# Patient Record
Sex: Female | Born: 1970 | Race: White | Hispanic: No | Marital: Single | Smoking: Never smoker
Health system: Southern US, Community
[De-identification: ages and names within clinical notes are randomized; demographics above are authoritative.]

## PROBLEM LIST (undated history)

## (undated) DIAGNOSIS — K5792 Diverticulitis of intestine, part unspecified, without perforation or abscess without bleeding: Secondary | ICD-10-CM

## (undated) DIAGNOSIS — N390 Urinary tract infection, site not specified: Secondary | ICD-10-CM

## (undated) DIAGNOSIS — E039 Hypothyroidism, unspecified: Secondary | ICD-10-CM

## (undated) DIAGNOSIS — E049 Nontoxic goiter, unspecified: Secondary | ICD-10-CM

## (undated) HISTORY — PX: LIPOSUCTION: SHX10

## (undated) HISTORY — PX: BREAST REDUCTION SURGERY: SHX8

---

## 2013-04-16 DIAGNOSIS — E039 Hypothyroidism, unspecified: Secondary | ICD-10-CM | POA: Insufficient documentation

## 2013-04-16 DIAGNOSIS — R21 Rash and other nonspecific skin eruption: Secondary | ICD-10-CM | POA: Insufficient documentation

## 2013-04-16 DIAGNOSIS — Z8249 Family history of ischemic heart disease and other diseases of the circulatory system: Secondary | ICD-10-CM | POA: Insufficient documentation

## 2013-04-19 DIAGNOSIS — N6459 Other signs and symptoms in breast: Secondary | ICD-10-CM | POA: Insufficient documentation

## 2013-04-19 DIAGNOSIS — Q838 Other congenital malformations of breast: Secondary | ICD-10-CM | POA: Insufficient documentation

## 2017-02-09 DIAGNOSIS — N978 Female infertility of other origin: Secondary | ICD-10-CM | POA: Insufficient documentation

## 2021-02-27 ENCOUNTER — Emergency Department: Payer: 59

## 2021-02-27 ENCOUNTER — Other Ambulatory Visit: Payer: Self-pay

## 2021-02-27 ENCOUNTER — Emergency Department
Admission: EM | Admit: 2021-02-27 | Discharge: 2021-02-27 | Disposition: A | Discharge status: Home / Self Care | Payer: 59 | Attending: Emergency Medicine | Admitting: Emergency Medicine

## 2021-02-27 ENCOUNTER — Encounter: Payer: Self-pay | Admitting: Emergency Medicine

## 2021-02-27 DIAGNOSIS — K5792 Diverticulitis of intestine, part unspecified, without perforation or abscess without bleeding: Secondary | ICD-10-CM | POA: Diagnosis not present

## 2021-02-27 DIAGNOSIS — R1032 Left lower quadrant pain: Secondary | ICD-10-CM | POA: Diagnosis present

## 2021-02-27 LAB — COMPREHENSIVE METABOLIC PANEL
ALT: 51 U/L — ABNORMAL HIGH (ref 0–44)
AST: 28 U/L (ref 15–41)
Albumin: 4.1 g/dL (ref 3.5–5.0)
Alkaline Phosphatase: 88 U/L (ref 38–126)
Anion gap: 10 (ref 5–15)
BUN: 9 mg/dL (ref 6–20)
CO2: 27 mmol/L (ref 22–32)
Calcium: 9.5 mg/dL (ref 8.9–10.3)
Chloride: 101 mmol/L (ref 98–111)
Creatinine, Ser: 0.9 mg/dL (ref 0.44–1.00)
GFR, Estimated: >60 mL/min (ref >60)
Glucose, Bld: 114 mg/dL — ABNORMAL HIGH (ref 70–99)
Potassium: 3.8 mmol/L (ref 3.5–5.1)
Sodium: 138 mmol/L (ref 135–145)
Total Bilirubin: 1 mg/dL (ref 0.3–1.2)
Total Protein: 8.2 g/dL — ABNORMAL HIGH (ref 6.5–8.1)

## 2021-02-27 LAB — LIPASE, BLOOD: Lipase: 26 U/L (ref 11–51)

## 2021-02-27 LAB — CBC
HCT: 43.5 % (ref 36.0–46.0)
Hemoglobin: 14.6 g/dL (ref 12.0–15.0)
MCH: 29 pg (ref 26.0–34.0)
MCHC: 33.6 g/dL (ref 30.0–36.0)
MCV: 86.3 fL (ref 80.0–100.0)
Platelets: 298 10*3/uL (ref 150–400)
RBC: 5.04 MIL/uL (ref 3.87–5.11)
RDW: 12.2 % (ref 11.5–15.5)
WBC: 10 10*3/uL (ref 4.0–10.5)
nRBC: 0 % (ref 0.0–0.2)

## 2021-02-27 LAB — URINALYSIS, COMPLETE (UACMP) WITH MICROSCOPIC
Bilirubin Urine: NEGATIVE
Glucose, UA: NEGATIVE mg/dL
Ketones, ur: 5 mg/dL — AB
Leukocytes,Ua: NEGATIVE
Nitrite: NEGATIVE
Protein, ur: 100 mg/dL — AB
Specific Gravity, Urine: 1.023 (ref 1.005–1.030)
pH: 5 (ref 5.0–8.0)

## 2021-02-27 LAB — PREGNANCY, URINE: Preg Test, Ur: NEGATIVE

## 2021-02-27 MED ORDER — AMOXICILLIN-POT CLAVULANATE 875-125 MG PO TABS: 1.0000 | ORAL_TABLET | Freq: Two times a day (BID) | ORAL | 0 refills | Status: AC

## 2021-02-27 MED ORDER — DOCUSATE SODIUM 100 MG PO CAPS: 100.0000 mg | ORAL_CAPSULE | Freq: Two times a day (BID) | ORAL | 2 refills | Status: AC

## 2021-02-27 MED ORDER — ONDANSETRON 4 MG PO TBDP: 4.0000 mg | ORAL_TABLET | Freq: Three times a day (TID) | ORAL | 0 refills | Status: AC | PRN

## 2021-02-27 MED ORDER — HYDROCODONE-ACETAMINOPHEN 5-325 MG PO TABS: 1.0000 | ORAL_TABLET | ORAL | 0 refills | Status: AC | PRN

## 2021-02-27 MED ORDER — AMOXICILLIN-POT CLAVULANATE 400-57 MG/5ML PO SUSR: 875.0000 mg | Freq: Two times a day (BID) | ORAL | 0 refills | Status: AC

## 2021-02-27 NOTE — ED Triage Notes (Signed)
Pt in with sharp and intermittent LLQ x 5 days. Reports some n/v, no diarrhea or blood in stool noted. Denies any fevers, area tender to palpation

## 2021-02-27 NOTE — ED Provider Notes (Signed)
[facility] Emergency Department Provider Note   ____________________________________________    I have reviewed the triage vital signs and the nursing notes.   HISTORY  Chief Complaint Abdominal Pain     HPI Katherine Landry is a 50 y.o. female who presents with complaints of left lower quadrant abdominal pain.  Patient reports several days of left lower quadrant abdominal pain, seems to have waxing waning quality, at times it is severe.  She has never had this before.  No history of abdominal surgeries.  Denies dysuria, no hematuria.  No fevers chills, no vomiting.  Has not take anything for this.  History reviewed. No pertinent past medical history.  There are no problems to display for this patient.   History reviewed. No pertinent surgical history.  Prior to Admission medications   Medication Sig Start Date End Date Taking? Authorizing Provider  amoxicillin-clavulanate (AUGMENTIN) 400-57 MG/5ML suspension Take 10.9 mLs (875 mg total) by mouth 2 (two) times daily for 7 days. 02/27/21 03/06/21 Yes Jene Every, MD  amoxicillin-clavulanate (AUGMENTIN) 875-125 MG tablet Take 1 tablet by mouth 2 (two) times daily for 7 days. 02/27/21 03/06/21 Yes Jene Every, MD  docusate sodium (COLACE) 100 MG capsule Take 1 capsule (100 mg total) by mouth 2 (two) times daily. 02/27/21 02/27/22 Yes Jene Every, MD  HYDROcodone-acetaminophen (NORCO/VICODIN) 5-325 MG tablet Take 1 tablet by mouth every 4 (four) hours as needed for moderate pain. 02/27/21  Yes Jene Every, MD  ondansetron (ZOFRAN ODT) 4 MG disintegrating tablet Take 1 tablet (4 mg total) by mouth every 8 (eight) hours as needed. 02/27/21  Yes Jene Every, MD     Allergies Macrobid [nitrofurantoin] and Sulfa antibiotics  No family history on file.  Social History Social History   Tobacco Use  . Smoking status: Never Smoker  . Smokeless tobacco: Never Used  Substance Use Topics  . Alcohol use:  Never  . Drug use: Never    Review of Systems  Constitutional: No fever/chills Eyes: No visual changes.  ENT: No sore throat. Cardiovascular: Denies chest pain. Respiratory: Denies shortness of breath. Gastrointestinal: As above Genitourinary: As above Musculoskeletal: Negative for back pain. Skin: Negative for rash. Neurological: Negative for headaches or weakness   ____________________________________________   PHYSICAL EXAM:  VITAL SIGNS: ED Triage Vitals [02/27/21 1014]  Enc Vitals Group     BP 131/75     Pulse Rate 88     Resp 18     Temp 98.4 F (36.9 C)     Temp Source Oral     SpO2 98 %     Weight 70.3 kg (155 lb)     Height 1.321 m (4\' 4" )     Head Circumference      Peak Flow      Pain Score      Pain Loc      Pain Edu?      Excl. in GC?     Constitutional: Alert and oriented.   Nose: No congestion/rhinnorhea. Mouth/Throat: Mucous membranes are moist.   Neck:  Painless ROM Cardiovascular: Normal rate, regular rhythm.  Good peripheral circulation. Respiratory: Normal respiratory effort.  No retractions. Lungs CTAB. Gastrointestinal: Mild tenderness palpation left lower quadrant no distention.  No CVA tenderness. Musculoskeletal:   Warm and well perfused Neurologic:  Normal speech and language. No gross focal neurologic deficits are appreciated.  Skin:  Skin is warm, dry and intact. No rash noted. Psychiatric: Mood and affect are normal. Speech and behavior  are normal.  ____________________________________________   LABS (all labs ordered are listed, but only abnormal results are displayed)  Labs Reviewed  COMPREHENSIVE METABOLIC PANEL - Abnormal; Notable for the following components:      Result Value   Glucose, Bld 114 (*)    Total Protein 8.2 (*)    ALT 51 (*)    All other components within normal limits  URINALYSIS, COMPLETE (UACMP) WITH MICROSCOPIC - Abnormal; Notable for the following components:   Color, Urine AMBER (*)     APPearance CLOUDY (*)    Hgb urine dipstick SMALL (*)    Ketones, ur 5 (*)    Protein, ur 100 (*)    Bacteria, UA MANY (*)    All other components within normal limits  LIPASE, BLOOD  CBC  PREGNANCY, URINE  POC URINE PREG, ED   ____________________________________________  EKG  None ____________________________________________  RADIOLOGY  CT renal stone study reviewed by me, consistent with diverticulitis ____________________________________________   PROCEDURES  Procedure(s) performed: No  Procedures   Critical Care performed: No ____________________________________________   INITIAL IMPRESSION / ASSESSMENT AND PLAN / ED COURSE  Pertinent labs & imaging results that were available during my care of the patient were reviewed by me and considered in my medical decision making (see chart for details).  Patient presents with left lower quadrant abdominal pain as detailed above, lab work is overall reassuring, normal white blood cell count.  Small mount of hemoglobin in the urine.  Recommended CT abdomen pelvis with IV contrast as well as analgesics and fluid and Zofran however patient refused IV, she would rather have Noncon scan she does not want an IV.  She understands that a noncontrast scan is not as sensitive as contrast scan.  CT renal stone study demonstrates acute diverticulitis, no perforation or rupture, lab work is reassuring, patient feels well she would like to go home she does not want to stay in the hospital.  We will treat with Augmentin, analgesics, Zofran, strict return precautions patient agrees with plan.    ____________________________________________   FINAL CLINICAL IMPRESSION(S) / ED DIAGNOSES  Final diagnoses:  Diverticulitis        Note:  This document was prepared using Dragon voice recognition software and may include unintentional dictation errors.   Jene Every, MD 02/27/21 1321

## 2021-02-27 NOTE — ED Notes (Signed)
Lab called for urine pregnancy to be added to urine already sent

## 2021-02-27 NOTE — ED Notes (Signed)
Patient transported to CT 

## 2021-04-13 ENCOUNTER — Ambulatory Visit: Payer: 59 | Admitting: Gastroenterology

## 2021-04-13 ENCOUNTER — Encounter: Payer: Self-pay | Admitting: Gastroenterology

## 2021-04-13 ENCOUNTER — Other Ambulatory Visit: Payer: Self-pay

## 2021-04-13 VITALS — BP 124/84 | HR 73 | Temp 98.4°F | Ht 64.0 in | Wt 160.8 lb

## 2021-04-13 DIAGNOSIS — K5732 Diverticulitis of large intestine without perforation or abscess without bleeding: Secondary | ICD-10-CM | POA: Diagnosis not present

## 2021-04-13 DIAGNOSIS — R748 Abnormal levels of other serum enzymes: Secondary | ICD-10-CM

## 2021-04-13 DIAGNOSIS — Z1211 Encounter for screening for malignant neoplasm of colon: Secondary | ICD-10-CM

## 2021-04-13 MED ORDER — PEG 3350-KCL-NA BICARB-NACL 420 G PO SOLR: ORAL | 0 refills | Status: AC

## 2021-04-13 NOTE — Patient Instructions (Signed)
High-Fiber Eating Plan Fiber, also called dietary fiber, is a type of carbohydrate. It is found foods such as fruits, vegetables, whole grains, and beans. A high-fiber diet can have many health benefits. Your health care provider may recommend a high-fiber diet to help:  Prevent constipation. Fiber can make your bowel movements more regular.  Lower your cholesterol.  Relieve the following conditions: ? Inflammation of veins in the anus (hemorrhoids). ? Inflammation of specific areas of the digestive tract (uncomplicated diverticulosis). ? A problem of the large intestine, also called the colon, that sometimes causes pain and diarrhea (irritable bowel syndrome, or IBS).  Prevent overeating as part of a weight-loss plan.  Prevent heart disease, type 2 diabetes, and certain cancers. What are tips for following this plan? Reading food labels  Check the nutrition facts label on food products for the amount of dietary fiber. Choose foods that have 5 grams of fiber or more per serving.  The goals for recommended daily fiber intake include: ? Men (age 50 or younger): 34-38 g. ? Men (over age 50): 28-34 g. ? Women (age 50 or younger): 25-28 g. ? Women (over age 50): 22-25 g. Your daily fiber goal is _____________ g.   Shopping  Choose whole fruits and vegetables instead of processed forms, such as apple juice or applesauce.  Choose a wide variety of high-fiber foods such as avocados, lentils, oats, and kidney beans.  Read the nutrition facts label of the foods you choose. Be aware of foods with added fiber. These foods often have high sugar and sodium amounts per serving. Cooking  Use whole-grain flour for baking and cooking.  Cook with brown rice instead of white rice. Meal planning  Start the day with a breakfast that is high in fiber, such as a cereal that contains 5 g of fiber or more per serving.  Eat breads and cereals that are made with whole-grain flour instead of refined  flour or white flour.  Eat brown rice, bulgur wheat, or millet instead of white rice.  Use beans in place of meat in soups, salads, and pasta dishes.  Be sure that half of the grains you eat each day are whole grains. General information  You can get the recommended daily intake of dietary fiber by: ? Eating a variety of fruits, vegetables, grains, nuts, and beans. ? Taking a fiber supplement if you are not able to take in enough fiber in your diet. It is better to get fiber through food than from a supplement.  Gradually increase how much fiber you consume. If you increase your intake of dietary fiber too quickly, you may have bloating, cramping, or gas.  Drink plenty of water to help you digest fiber.  Choose high-fiber snacks, such as berries, raw vegetables, nuts, and popcorn. What foods should I eat? Fruits Berries. Pears. Apples. Oranges. Avocado. Prunes and raisins. Dried figs. Vegetables Sweet potatoes. Spinach. Kale. Artichokes. Cabbage. Broccoli. Cauliflower. Green peas. Carrots. Squash. Grains Whole-grain breads. Multigrain cereal. Oats and oatmeal. Brown rice. Barley. Bulgur wheat. Millet. Quinoa. Bran muffins. Popcorn. Rye wafer crackers. Meats and other proteins Navy beans, kidney beans, and pinto beans. Soybeans. Split peas. Lentils. Nuts and seeds. Dairy Fiber-fortified yogurt. Beverages Fiber-fortified soy milk. Fiber-fortified orange juice. Other foods Fiber bars. The items listed above may not be a complete list of recommended foods and beverages. Contact a dietitian for more information. What foods should I avoid? Fruits Fruit juice. Cooked, strained fruit. Vegetables Fried potatoes. Canned vegetables. Well-cooked vegetables. Grains   White bread. Pasta made with refined flour. White rice. Meats and other proteins Fatty cuts of meat. Fried chicken or fried fish. Dairy Milk. Yogurt. Cream cheese. Sour cream. Fats and oils Butters. Beverages Soft  drinks. Other foods Cakes and pastries. The items listed above may not be a complete list of foods and beverages to avoid. Talk with your dietitian about what choices are best for you. Summary  Fiber is a type of carbohydrate. It is found in foods such as fruits, vegetables, whole grains, and beans.  A high-fiber diet has many benefits. It can help to prevent constipation, lower blood cholesterol, aid weight loss, and reduce your risk of heart disease, diabetes, and certain cancers.  Increase your intake of fiber gradually. Increasing fiber too quickly may cause cramping, bloating, and gas. Drink plenty of water while you increase the amount of fiber you consume.  The best sources of fiber include whole fruits and vegetables, whole grains, nuts, seeds, and beans. This information is not intended to replace advice given to you by your health care provider. Make sure you discuss any questions you have with your health care provider. Document Revised: 04/16/2020 Document Reviewed: 04/16/2020 Elsevier Patient Education  2021 Elsevier Inc.  

## 2021-04-13 NOTE — Addendum Note (Signed)
Addended by: Adela Ports on: 04/13/2021 02:47 PM   Modules accepted: Orders

## 2021-04-13 NOTE — Progress Notes (Signed)
[facility] [address]  [address]  [place], [place] [address]  Main: [phone]  Fax: [phone]   Gastroenterology Consultation  Referring Provider:     Ann Maki, MD Primary Care Physician:  Patient, No Pcp Per (Inactive) Reason for Consultation:     Diverticulitis        HPI:    Chief Complaint  Patient presents with  . Diverticulitis    Angelic Schnelle is a 50 y.o. y/o female referred for consultation & management  by Dr. Patient, No Pcp Per (Inactive).  Patient went to the ER on 02/27/2021 due to abdominal pain and was diagnosed with diverticulitis.  She received antibiotics and was discharged from the ER without oral antibiotics.  She completed her course and has had complete resolution of her abdominal pain.  No previous episodes of diverticulitis.  No blood in stool.  No altered bowel habits.  No family history of colon cancer.  No prior colonoscopy.  Patient denies any dysphagia, heartburn, weight loss.  No past medical history on file.  No past surgical history on file.  Prior to Admission medications   Medication Sig Start Date End Date Taking? Authorizing Provider  docusate sodium (COLACE) 100 MG capsule Take 1 capsule (100 mg total) by mouth 2 (two) times daily. 02/27/21 02/27/22  Jene Every, MD  HYDROcodone-acetaminophen (NORCO/VICODIN) 5-325 MG tablet Take 1 tablet by mouth every 4 (four) hours as needed for moderate pain. 02/27/21   Jene Every, MD  levothyroxine (SYNTHROID) 25 MCG tablet Take 1 tablet by mouth daily.    [provider]  ondansetron (ZOFRAN ODT) 4 MG disintegrating tablet Take 1 tablet (4 mg total) by mouth every 8 (eight) hours as needed. 02/27/21   Jene Every, MD    No family history on file.   Social History   Tobacco Use  . Smoking status: Never Smoker  . Smokeless tobacco: Never Used  Substance Use Topics  . Alcohol use: Never  . Drug use: Never    Allergies as of 04/13/2021 - Review Complete  02/27/2021  Allergen Reaction Noted  . Macrobid [nitrofurantoin]  02/27/2021  . Sulfa antibiotics  02/27/2021    Review of Systems:    All systems reviewed and negative except where noted in HPI.   Physical Exam:  BP 124/84   Pulse 73   Temp 98.4 F (36.9 C) (Oral)   Ht 5\' 4"  (1.626 m)   Wt 160 lb 12.8 oz (72.9 kg)   BMI 27.60 kg/m  No LMP recorded. Psych:  Alert and cooperative. Normal mood and affect. General:   Alert,  Well-developed, well-nourished, pleasant and cooperative in NAD Head:  Normocephalic and atraumatic. Eyes:  Sclera clear, no icterus.   Conjunctiva pink. Ears:  Normal auditory acuity. Nose:  No deformity, discharge, or lesions. Mouth:  No deformity or lesions,oropharynx pink & moist. Neck:  Supple; no masses or thyromegaly. Abdomen:  Normal bowel sounds.  No bruits.  Soft, non-tender and non-distended without masses, hepatosplenomegaly or hernias noted.  No guarding or rebound tenderness.    Msk:  Symmetrical without gross deformities. Good, equal movement & strength bilaterally. Pulses:  Normal pulses noted. Extremities:  No clubbing or edema.  No cyanosis. Neurologic:  Alert and oriented x3;  grossly normal neurologically. Skin:  Intact without significant lesions or rashes. No jaundice. Lymph Nodes:  No significant cervical adenopathy. Psych:  Alert and cooperative. Normal mood and affect.   Labs: CBC    Component Value Date/Time  WBC 10.0 02/27/2021 1015   RBC 5.04 02/27/2021 1015   HGB 14.6 02/27/2021 1015   HCT 43.5 02/27/2021 1015   PLT 298 02/27/2021 1015   MCV 86.3 02/27/2021 1015   MCH 29.0 02/27/2021 1015   MCHC 33.6 02/27/2021 1015   RDW 12.2 02/27/2021 1015   CMP     Component Value Date/Time   NA 138 02/27/2021 1015   K 3.8 02/27/2021 1015   CL 101 02/27/2021 1015   CO2 27 02/27/2021 1015   GLUCOSE 114 (H) 02/27/2021 1015   BUN 9 02/27/2021 1015   CREATININE 0.90 02/27/2021 1015   CALCIUM 9.5 02/27/2021 1015   PROT 8.2  (H) 02/27/2021 1015   ALBUMIN 4.1 02/27/2021 1015   AST 28 02/27/2021 1015   ALT 51 (H) 02/27/2021 1015   ALKPHOS 88 02/27/2021 1015   BILITOT 1.0 02/27/2021 1015   GFRNONAA >60 02/27/2021 1015    Imaging Studies: No results found.  Assessment and Plan:   Lameeka Schleifer is a 50 y.o. y/o female has been referred for diverticulitis  High-fiber diet discussed and handout given for the same  Patient is due for screening colonoscopy, which would also allow [place] to evaluate and rule out any underlying lesions that could have contributed to her diverticulitis  I have discussed alternative options, risks & benefits,  which include, but are not limited to, bleeding, infection, perforation,respiratory complication & drug reaction.  The patient agrees with this plan & written consent will be obtained.    Patient also with mildly elevated transaminases during the ER visit.  We will repeat at this time elevated liver enzymes  Dr Melodie Bouillon  Speech recognition software was used to dictate the above note.

## 2021-04-14 ENCOUNTER — Telehealth: Payer: Self-pay

## 2021-04-14 NOTE — Telephone Encounter (Signed)
Patient has questions concerning the prep medication. Says she didn't receive a packet during her visit on yesterday.

## 2021-04-15 NOTE — Telephone Encounter (Signed)
Patient was contacted but had to leave her a detailed message letting her know about her procedure date and prep information. I also let her know that if she had further questions to call us back. Patient was also informed that I had sent her packet information by mail since she did not have a MyChart account and she had left the room before me going back to explain the process of everything before her procedure. Prep was sent to her pharmacy [facility] on [address].

## 2021-04-28 ENCOUNTER — Encounter: Payer: Self-pay | Admitting: Gastroenterology

## 2021-04-29 ENCOUNTER — Encounter
Admission: RE | Disposition: A | Discharge status: Home / Self Care | Payer: Self-pay | Source: Home / Self Care | Attending: Gastroenterology

## 2021-04-29 ENCOUNTER — Encounter: Payer: Self-pay | Admitting: Gastroenterology

## 2021-04-29 ENCOUNTER — Other Ambulatory Visit: Payer: Self-pay

## 2021-04-29 ENCOUNTER — Ambulatory Visit: Payer: 59 | Admitting: Anesthesiology

## 2021-04-29 ENCOUNTER — Ambulatory Visit
Admission: RE | Admit: 2021-04-29 | Discharge: 2021-04-29 | Disposition: A | Discharge status: Home / Self Care | Payer: 59 | Source: Home / Self Care | Attending: Gastroenterology | Admitting: Gastroenterology

## 2021-04-29 HISTORY — DX: Diverticulitis of intestine, part unspecified, without perforation or abscess without bleeding: K57.92

## 2021-04-29 HISTORY — DX: Urinary tract infection, site not specified: N39.0

## 2021-04-29 HISTORY — DX: Hypothyroidism, unspecified: E03.9

## 2021-04-29 HISTORY — DX: Nontoxic goiter, unspecified: E04.9

## 2021-04-29 SURGERY — COLONOSCOPY WITH PROPOFOL
Anesthesia: General

## 2021-04-29 MED ORDER — PROPOFOL 500 MG/50ML IV EMUL
INTRAVENOUS | Status: AC
  Filled 2021-04-29: qty 50

## 2021-04-29 MED ORDER — LIDOCAINE HCL (PF) 2 % IJ SOLN
INTRAMUSCULAR | Status: AC
  Filled 2021-04-29: qty 5

## 2021-04-29 NOTE — Anesthesia Preprocedure Evaluation (Signed)
Anesthesia Evaluation  Patient identified by MRN, date of birth, ID band Patient awake    Reviewed: Allergy & Precautions, NPO status , Patient's Chart, lab work & pertinent test results  Airway Mallampati: II  TM Distance: >3 FB     Dental  (+) Teeth Intact   Pulmonary neg pulmonary ROS,    Pulmonary exam normal        Cardiovascular negative cardio ROS Normal cardiovascular exam     Neuro/Psych negative neurological ROS  negative psych ROS   GI/Hepatic Neg liver ROS, Diverticulitis Hx   Endo/Other  Hypothyroidism   Renal/GU negative Renal ROS  negative genitourinary   Musculoskeletal negative musculoskeletal ROS (+)   Abdominal Normal abdominal exam  (+)   Peds negative pediatric ROS (+)  Hematology negative hematology ROS (+)   Anesthesia Other Findings Past Medical History: No date: Diverticulitis No date: Hypothyroidism No date: Nontoxic goiter No date: UTI (urinary tract infection)  Reproductive/Obstetrics                             Anesthesia Physical Anesthesia Plan  ASA: II  Anesthesia Plan: General   Post-op Pain Management:    Induction: Intravenous  PONV Risk Score and Plan: Propofol infusion  Airway Management Planned: Nasal Cannula  Additional Equipment:   Intra-op Plan:   Post-operative Plan:   Informed Consent: I have reviewed the patients History and Physical, chart, labs and discussed the procedure including the risks, benefits and alternatives for the proposed anesthesia with the patient or authorized representative who has indicated his/her understanding and acceptance.     Dental advisory given  Plan Discussed with: CRNA and Surgeon  Anesthesia Plan Comments:         Anesthesia Quick Evaluation

## 2021-07-14 ENCOUNTER — Ambulatory Visit: Payer: 59 | Admitting: Gastroenterology

## 2021-12-02 IMAGING — CT CT RENAL STONE PROTOCOL
2 of 4 series · 15 of 46 positions shown, 17 images · non-contrast
Comparison: None.

CLINICAL DATA: Pt in with sharp and intermittent LLQ x 5 days.
Reports some n/v, no diarrhea or blood in stool noted. Denies any
fevers, area tender to palpationFlank pain, kidney stone suspected

EXAM:
CT ABDOMEN AND PELVIS WITHOUT CONTRAST
TECHNIQUE: Multidetector CT imaging of the abdomen and pelvis was performed
following the standard protocol without IV contrast.

[Series 2: stone full standard · axial · 0.74mm/px · z∈[-489,-49]mm · 12 of 101 slices shown, 14 images]
[im 9/101  soft-tissue]
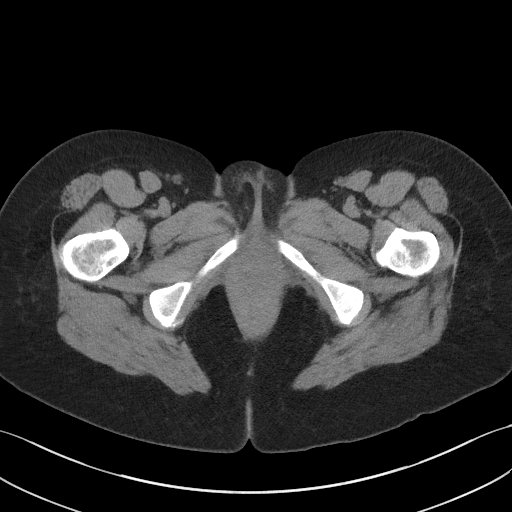
[im 9/101  bone]
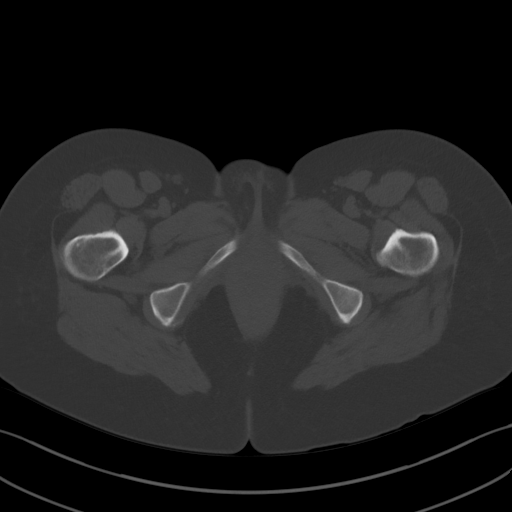
[im 17/101  soft-tissue]
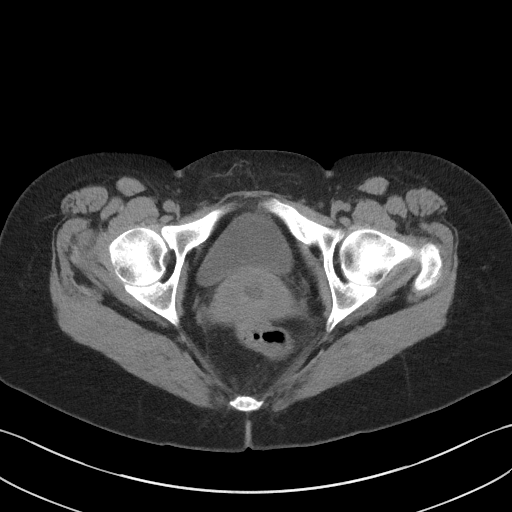
[im 25/101  soft-tissue]
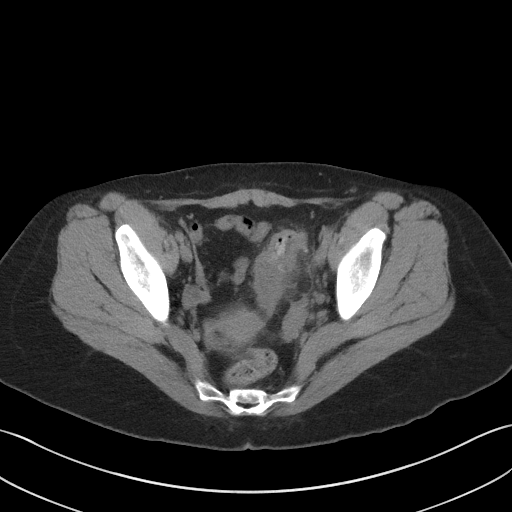
[im 33/101  soft-tissue]
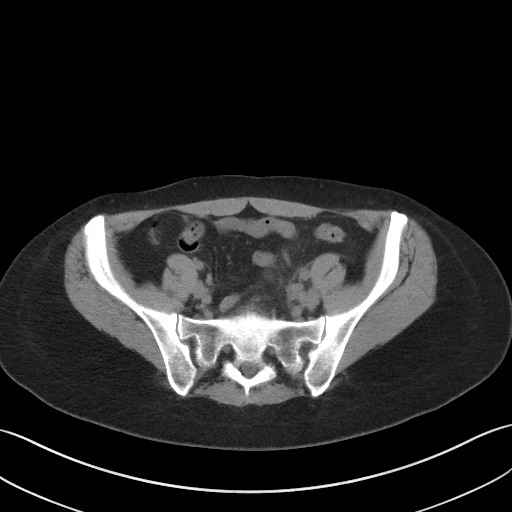
[im 41/101  soft-tissue]
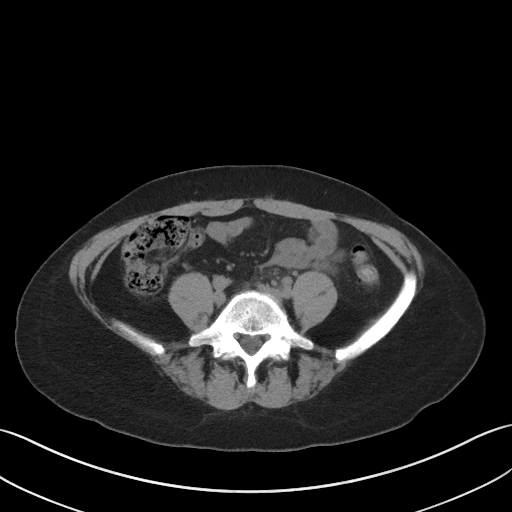
[im 49/101  soft-tissue]
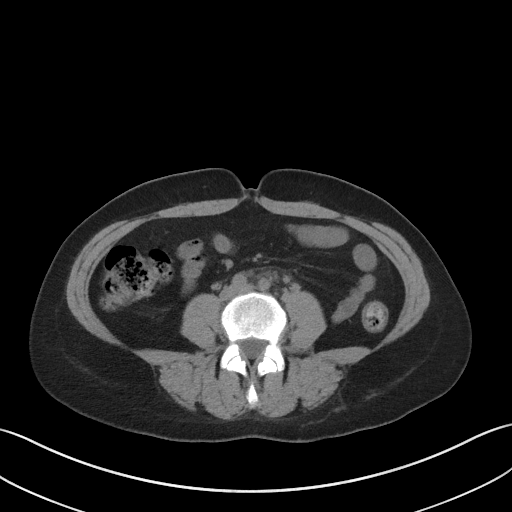
[im 57/101  soft-tissue]
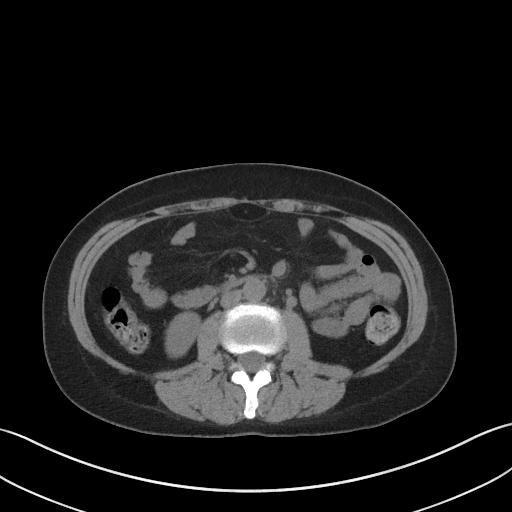
[im 65/101  soft-tissue]
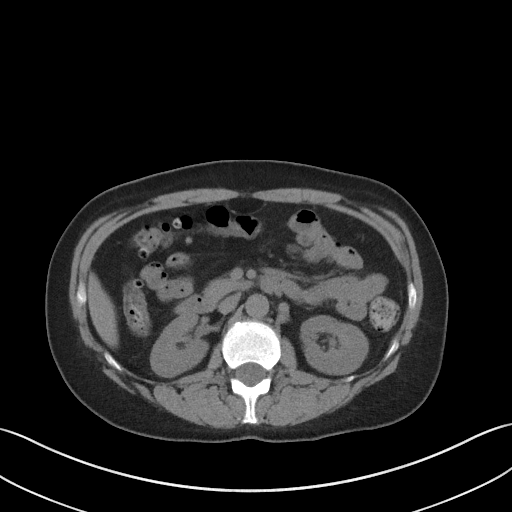
[im 73/101  soft-tissue]
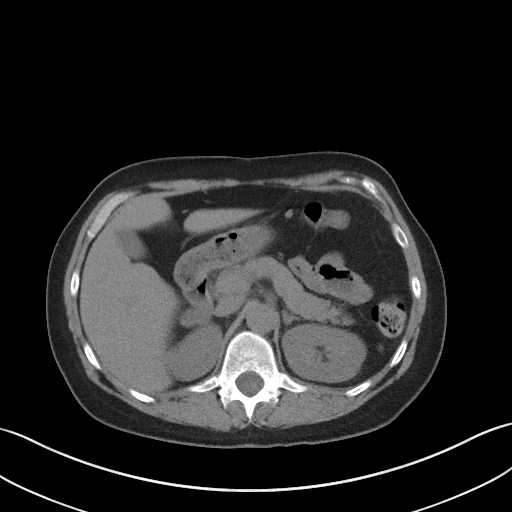
[im 73/101  bone]
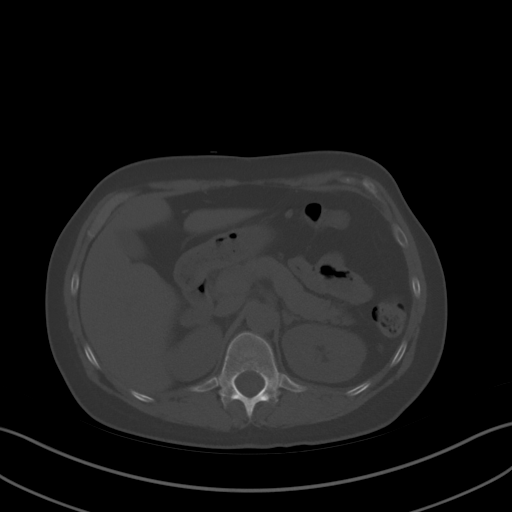
[im 81/101  soft-tissue]
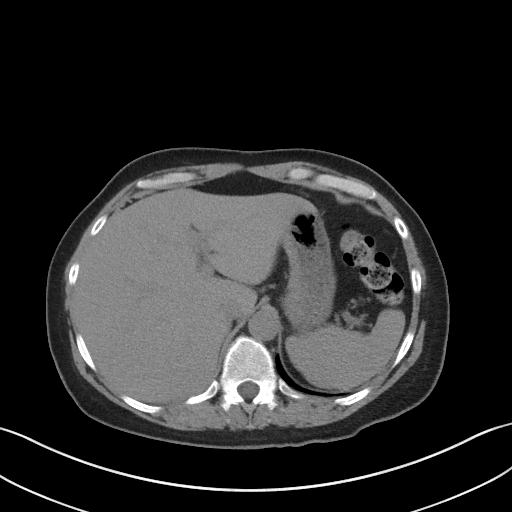
[im 89/101  soft-tissue]
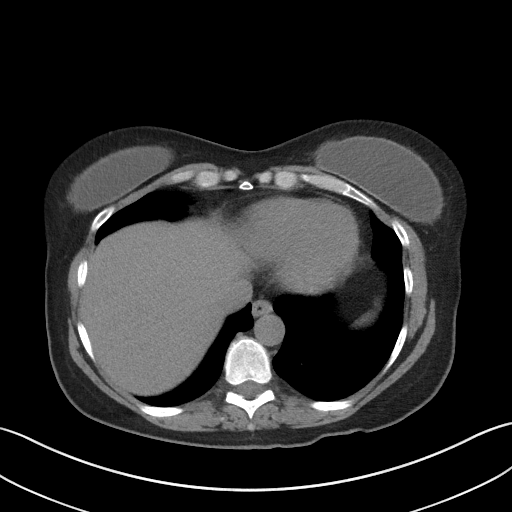
[im 97/101  soft-tissue]
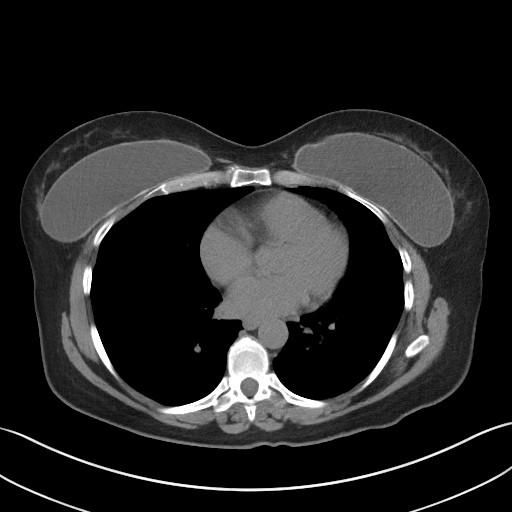

[Series 5: coronal · coronal · 0.70mm/px · 3 of 119 slices shown]
[im 40/119  soft-tissue]
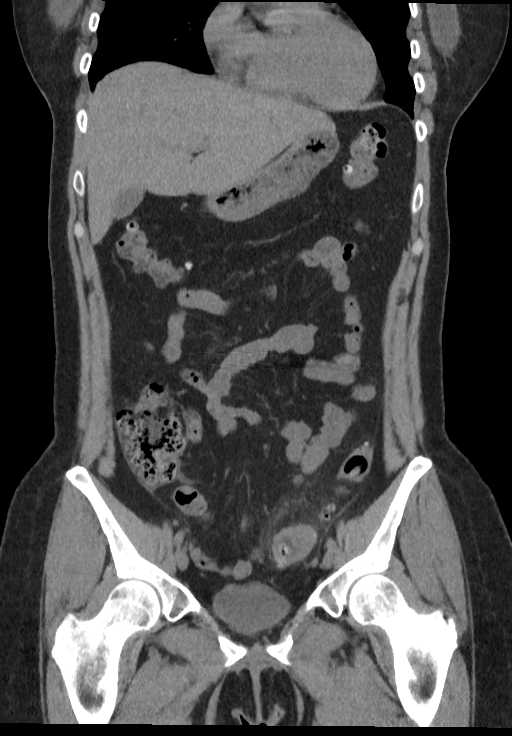
[im 53/119  soft-tissue]
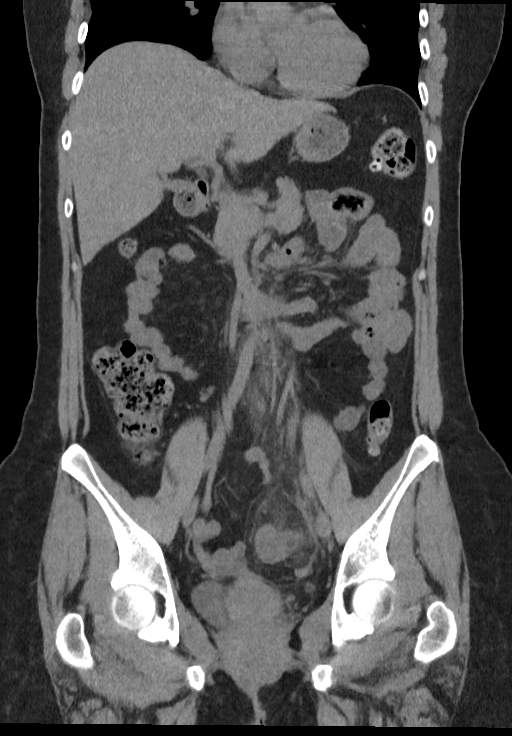
[im 66/119  soft-tissue]
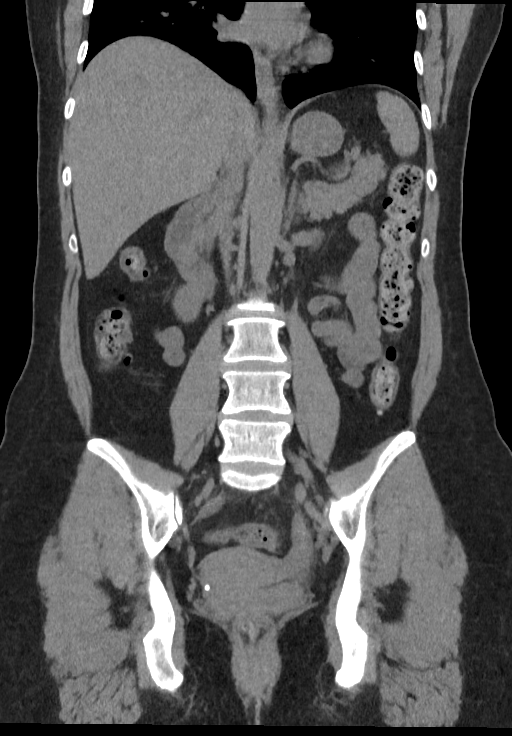

[15 of 46 positions shown; findings below may reference images not displayed]

FINDINGS: Lower chest: Lung bases are clear.

Hepatobiliary: No focal hepatic lesion. No biliary duct dilatation.
Common bile duct is normal.

Pancreas: Pancreas is normal. No ductal dilatation. No pancreatic
inflammation.

Spleen: Normal spleen

Adrenals/urinary tract: Adrenal glands and kidneys are normal. The
ureters and bladder normal.

Stomach/Bowel: Stomach, duodenum small-bowel normal. Terminal ileum
and appendix are normal. Ascending, transverse colon normal.
Descending colon normal.

There is a 8 cm segment of bowel wall inflammation involving the
proximal sigmoid colon (image 76/2). There is stranding in the
sigmoid mesocolon and venous congestion (coronal image 49). There
are several diverticula through this region. Findings most
suggestive of acute diverticulitis. No macro perforation. No abscess
formation. Rectum normal.

Vascular/Lymphatic: Abdominal aorta is normal caliber. No periportal
or retroperitoneal adenopathy. No pelvic adenopathy.

Reproductive: Uterus and adnexa unremarkable.

Other: No free fluid.

Musculoskeletal: No aggressive osseous lesion.
IMPRESSION: 1. Acute diverticulitis of the proximal sigmoid colon. Robust
inflammatory reaction within the sigmoid mesocolon; however, no
evidence of frank perforation or abscess.
2. No ureterolithiasis or obstructive uropathy.

## 2022-09-09 ENCOUNTER — Other Ambulatory Visit: Payer: Self-pay

## 2022-09-09 DIAGNOSIS — E039 Hypothyroidism, unspecified: Secondary | ICD-10-CM | POA: Diagnosis not present

## 2022-09-09 DIAGNOSIS — K5732 Diverticulitis of large intestine without perforation or abscess without bleeding: Secondary | ICD-10-CM | POA: Insufficient documentation

## 2022-09-09 DIAGNOSIS — R1032 Left lower quadrant pain: Secondary | ICD-10-CM | POA: Diagnosis present

## 2022-09-09 LAB — URINALYSIS, ROUTINE W REFLEX MICROSCOPIC
Bacteria, UA: NONE SEEN
Bilirubin Urine: NEGATIVE
Glucose, UA: NEGATIVE mg/dL
Hgb urine dipstick: NEGATIVE
Ketones, ur: NEGATIVE mg/dL
Nitrite: NEGATIVE
Protein, ur: NEGATIVE mg/dL
Specific Gravity, Urine: 1.02 (ref 1.005–1.030)
pH: 5 (ref 5.0–8.0)

## 2022-09-09 LAB — COMPREHENSIVE METABOLIC PANEL
ALT: 30 U/L (ref 0–44)
AST: 22 U/L (ref 15–41)
Albumin: 4.7 g/dL (ref 3.5–5.0)
Alkaline Phosphatase: 66 U/L (ref 38–126)
Anion gap: 9 (ref 5–15)
BUN: 17 mg/dL (ref 6–20)
CO2: 25 mmol/L (ref 22–32)
Calcium: 9.6 mg/dL (ref 8.9–10.3)
Chloride: 105 mmol/L (ref 98–111)
Creatinine, Ser: 0.89 mg/dL (ref 0.44–1.00)
GFR, Estimated: >60 mL/min (ref >60)
Glucose, Bld: 98 mg/dL (ref 70–99)
Potassium: 4 mmol/L (ref 3.5–5.1)
Sodium: 139 mmol/L (ref 135–145)
Total Bilirubin: 0.5 mg/dL (ref 0.3–1.2)
Total Protein: 7.8 g/dL (ref 6.5–8.1)

## 2022-09-09 LAB — CBC
HCT: 44.5 % (ref 36.0–46.0)
Hemoglobin: 14.4 g/dL (ref 12.0–15.0)
MCH: 27.9 pg (ref 26.0–34.0)
MCHC: 32.4 g/dL (ref 30.0–36.0)
MCV: 86.2 fL (ref 80.0–100.0)
Platelets: 231 10*3/uL (ref 150–400)
RBC: 5.16 MIL/uL — ABNORMAL HIGH (ref 3.87–5.11)
RDW: 12.1 % (ref 11.5–15.5)
WBC: 9.9 10*3/uL (ref 4.0–10.5)
nRBC: 0 % (ref 0.0–0.2)

## 2022-09-09 LAB — LIPASE, BLOOD: Lipase: 36 U/L (ref 11–51)

## 2022-09-09 LAB — POC URINE PREG, ED: Preg Test, Ur: NEGATIVE

## 2022-09-09 NOTE — ED Triage Notes (Signed)
Abd pain. Hx of diverticulitis and states pain feels the same. Reports intermittent diarrhea and constipation. Denies fever or n/v. Denies urinary symptoms.

## 2022-09-10 ENCOUNTER — Emergency Department
Admission: EM | Admit: 2022-09-10 | Discharge: 2022-09-10 | Disposition: A | Discharge status: Home / Self Care | Payer: 59 | Attending: Emergency Medicine | Admitting: Emergency Medicine

## 2022-09-10 ENCOUNTER — Emergency Department: Payer: 59

## 2022-09-10 DIAGNOSIS — K5732 Diverticulitis of large intestine without perforation or abscess without bleeding: Secondary | ICD-10-CM

## 2022-09-10 MED ORDER — ONDANSETRON 4 MG PO TBDP: 4.0000 mg | ORAL_TABLET | Freq: Three times a day (TID) | ORAL | 0 refills | Status: DC | PRN

## 2022-09-10 MED ORDER — AMOXICILLIN-POT CLAVULANATE 400-57 MG/5ML PO SUSR: 875.0000 mg | Freq: Two times a day (BID) | ORAL | 0 refills | Status: DC

## 2022-09-10 MED ORDER — KETOROLAC TROMETHAMINE 15 MG/ML IJ SOLN
15.0000 mg | Freq: Once | INTRAMUSCULAR | Status: AC
  Administered 2022-09-10: 15 mg via INTRAVENOUS
  Filled 2022-09-10: qty 1

## 2022-09-10 MED ORDER — AMOXICILLIN-POT CLAVULANATE 400-57 MG/5ML PO SUSR: 875.0000 mg | Freq: Two times a day (BID) | ORAL | 0 refills | Status: AC

## 2022-09-10 MED ORDER — IOHEXOL 300 MG/ML  SOLN
100.0000 mL | Freq: Once | INTRAMUSCULAR | Status: AC | PRN
  Administered 2022-09-10: 100 mL via INTRAVENOUS

## 2022-09-10 MED ORDER — ONDANSETRON HCL 4 MG/2ML IJ SOLN
4.0000 mg | Freq: Once | INTRAMUSCULAR | Status: AC
  Administered 2022-09-10: 4 mg via INTRAVENOUS
  Filled 2022-09-10: qty 2

## 2022-09-10 MED ORDER — ONDANSETRON 4 MG PO TBDP: 4.0000 mg | ORAL_TABLET | Freq: Three times a day (TID) | ORAL | 0 refills | Status: AC | PRN

## 2022-09-10 MED ORDER — AMOXICILLIN-POT CLAVULANATE 400-57 MG/5ML PO SUSR
875.0000 mg | Freq: Once | ORAL | Status: AC
  Administered 2022-09-10: 872 mg via ORAL
  Filled 2022-09-10: qty 10.9

## 2022-09-10 NOTE — Discharge Instructions (Addendum)
Your CAT scan shows diverticulitis.  Please take the antibiotic twice a day for the next 7 days.  You can use the Zofran as needed for nausea and vomiting.  Please return to the emergency department for fevers worsening pain or inability to drink.

## 2022-09-10 NOTE — ED Provider Notes (Signed)
[facility] Provider Note    Event Date/Time   First MD Initiated Contact with Patient 09/10/22 [phone]     (approximate)   History   Abdominal Pain   HPI  Katherine Landry is a 51 y.o. female with past medical history of diverticulitis, hypothyroidism who presents with left lower quadrant pain.  Symptoms started several days ago but worsened today.  Patient has intermittent pain in the left lower quadrant.  Has had some intermittent diarrhea and constipation.  Has had nausea but no vomiting denies fevers or chills.  No urinary symptoms.  Has had history of diverticulitis in the past this feels similar.     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 --      Excl. in [place]? --     Most recent vital signs: Vitals:   09/09/22 2102 09/10/22 0039  BP: (!) 139/92 133/88  Pulse: 86 82  Resp: 18 18  Temp: 98.2 F (36.8 C) 98.2 F (36.8 C)  SpO2: 99% 99%     General: Awake, no distress.  CV:  Good peripheral perfusion.  Resp:  Normal effort.  Abd:  No distention.  Mild  tenderness to palpation left lower quadrant but there is no guarding abdomen soft Neuro:             Awake, Alert, Oriented x 3  Other:     ED Results / Procedures / Treatments  Labs (all labs ordered are listed, but only abnormal results are displayed) Labs Reviewed  CBC - Abnormal; Notable for the following components:      Result Value   RBC 5.16 (*)    All other components within normal limits  URINALYSIS, ROUTINE W REFLEX MICROSCOPIC - Abnormal; Notable for the following components:   Color, Urine YELLOW (*)    APPearance HAZY (*)    Leukocytes,Ua TRACE (*)    All other components within normal limits  LIPASE, BLOOD  COMPREHENSIVE METABOLIC PANEL  POC URINE PREG, ED     EKG    RADIOLOGY CT abdomen pelvis reviewed interpreted myself shows diverticulitis without abscess   PROCEDURES:  Critical Care performed: No  Procedures  The patient is on the cardiac monitor to evaluate for evidence of arrhythmia and/or significant heart  rate changes.   MEDICATIONS ORDERED IN ED: Medications  ketorolac (TORADOL) 15 MG/ML injection 15 mg (has no administration in time range)  ondansetron (ZOFRAN) injection 4 mg (has no administration in time range)  amoxicillin-clavulanate (AUGMENTIN) 400-57 MG/5ML suspension 872 mg (has no administration in time range)  iohexol (OMNIPAQUE) 300 MG/ML solution 100 mL (100 mLs Intravenous Contrast Given 09/10/22 0048)     IMPRESSION / MDM / ASSESSMENT AND PLAN / ED COURSE  I reviewed the triage vital signs and the nursing notes.                              Patient's presentation is most consistent with acute presentation with potential threat to life or bodily function.  Differential diagnosis includes, but is not limited to, diverticulitis, complicated diverticulitis with abscess or perforation, pyelonephritis, UTI, colitis, constipation  The patient is a 51 year old female who has had diverticulitis in the past presenting with left  lower quadrant pain for several days worsening today.  Has noted no fevers or chills.  Tolerating p.o.  Nausea but no vomiting and some constipation and diarrhea.  She appears well she has mild tenderness left lower quadrant there is no guarding and abdomen is benign overall.  Labs overall reassuring she has no leukocytosis pregnancy test is negative.  UA with just 6-10 WBCs.  CT abdomen pelvis with contrast obtained shows diverticulitis without abscess or other complicating factor.  Given his pain is controlled she is tolerating p.o. is not septic I think she is appropriate for outpatient antibiotics.  Discharged with 7 days of Augmentin and Zofran prescription.       FINAL CLINICAL IMPRESSION(S) / ED DIAGNOSES   Final diagnoses:  Diverticulitis of colon     Rx / DC Orders   ED Discharge Orders          Ordered    amoxicillin-clavulanate (AUGMENTIN) 400-57 MG/5ML suspension  2 times daily        09/10/22 0247    ondansetron (ZOFRAN-ODT) 4 MG disintegrating tablet  Every 8 hours PRN        09/10/22 0247             Note:  This document was prepared using Dragon voice recognition software and may include unintentional dictation errors.   Georga Hacking, MD 09/10/22 [phone]
